# Patient Record
Sex: Male | Born: 1975 | Race: White | Hispanic: No | Marital: Married | Smoking: Never smoker
Health system: Southern US, Community
[De-identification: ages and names within clinical notes are randomized; demographics above are authoritative.]

## PROBLEM LIST (undated history)

## (undated) ENCOUNTER — Ambulatory Visit: Payer: Self-pay

---

## 2013-02-08 ENCOUNTER — Ambulatory Visit: Payer: Self-pay | Admitting: Family Medicine

## 2014-05-27 ENCOUNTER — Ambulatory Visit: Payer: Self-pay | Admitting: Family Medicine

## 2014-06-21 ENCOUNTER — Ambulatory Visit: Payer: Self-pay | Admitting: Emergency Medicine

## 2014-12-14 ENCOUNTER — Emergency Department: Payer: Self-pay | Admitting: Student

## 2014-12-14 LAB — CBC
HCT: 47.8 % (ref 40.0–52.0)
HGB: 16.7 g/dL (ref 13.0–18.0)
MCH: 31.4 pg (ref 26.0–34.0)
MCHC: 35 g/dL (ref 32.0–36.0)
MCV: 90 fL (ref 80–100)
Platelet: 205 10*3/uL (ref 150–440)
RBC: 5.32 10*6/uL (ref 4.40–5.90)
RDW: 12.4 % (ref 11.5–14.5)
WBC: 9 10*3/uL (ref 3.8–10.6)

## 2014-12-14 LAB — BASIC METABOLIC PANEL
Anion Gap: 8 (ref 7–16)
BUN: 14 mg/dL (ref 7–18)
CALCIUM: 9 mg/dL (ref 8.5–10.1)
CO2: 27 mmol/L (ref 21–32)
CREATININE: 1.21 mg/dL (ref 0.60–1.30)
Chloride: 102 mmol/L (ref 98–107)
EGFR (African American): >60
EGFR (Non-African Amer.): >60
Glucose: 95 mg/dL (ref 65–99)
Osmolality: 274 (ref 275–301)
POTASSIUM: 3.6 mmol/L (ref 3.5–5.1)
SODIUM: 137 mmol/L (ref 136–145)

## 2014-12-14 LAB — TROPONIN I: Troponin-I: <0.02 ng/mL

## 2015-03-05 ENCOUNTER — Encounter: Payer: Self-pay | Admitting: Family Medicine

## 2015-03-05 DIAGNOSIS — F1010 Alcohol abuse, uncomplicated: Secondary | ICD-10-CM

## 2015-03-05 DIAGNOSIS — R5381 Other malaise: Secondary | ICD-10-CM | POA: Insufficient documentation

## 2015-03-05 DIAGNOSIS — R5383 Other fatigue: Secondary | ICD-10-CM

## 2015-04-20 IMAGING — CR RIGHT ELBOW - 2 VIEW
2 series · 2 of 2 positions shown · non-contrast
Comparison: None.

CLINICAL DATA: Left elbow pain swelling and redness for 5 days. No
known injury.

EXAM:
RIGHT ELBOW - 2 VIEW

[elbow ap]
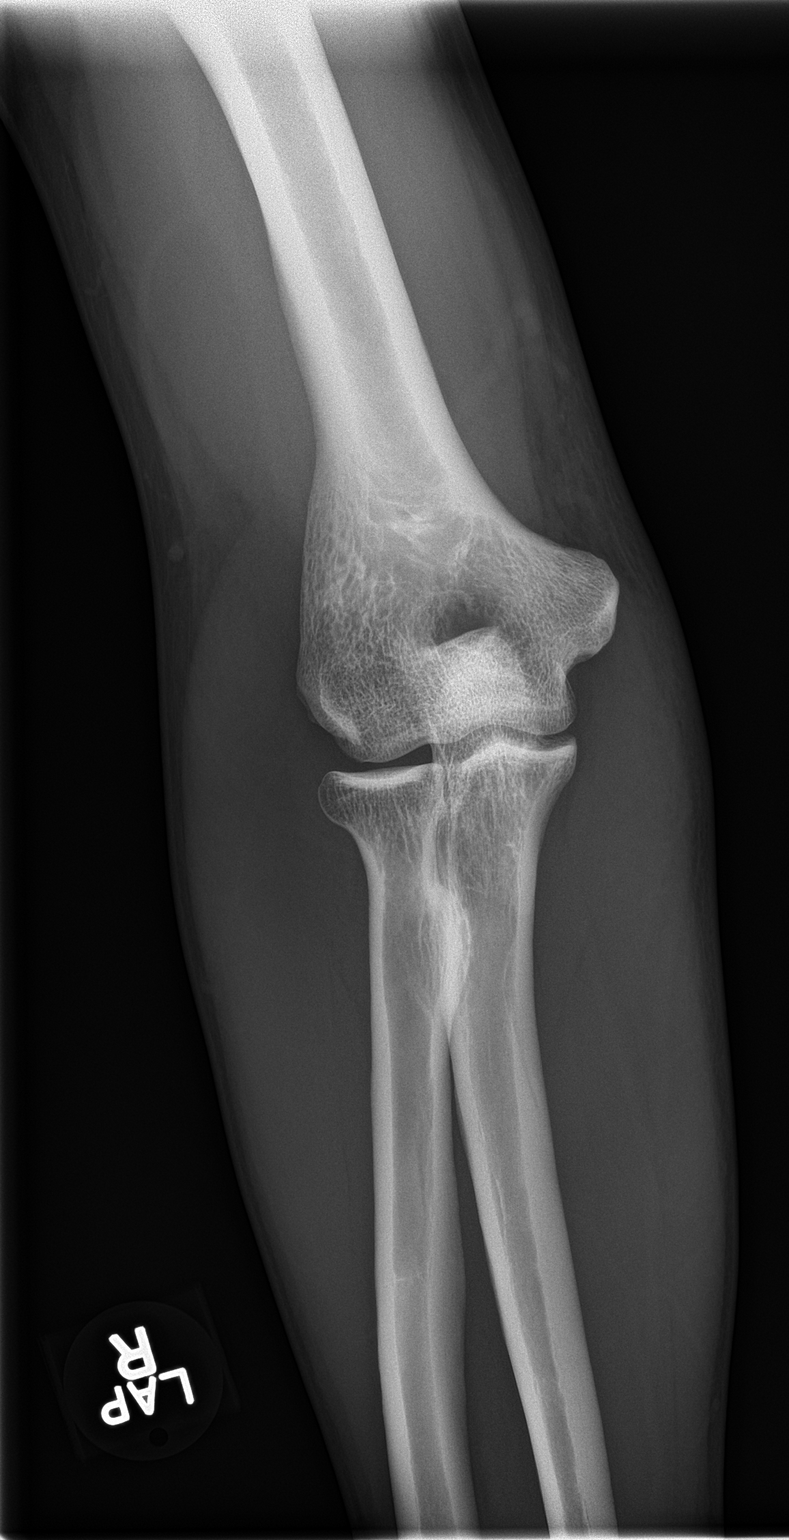

[elbow lat]
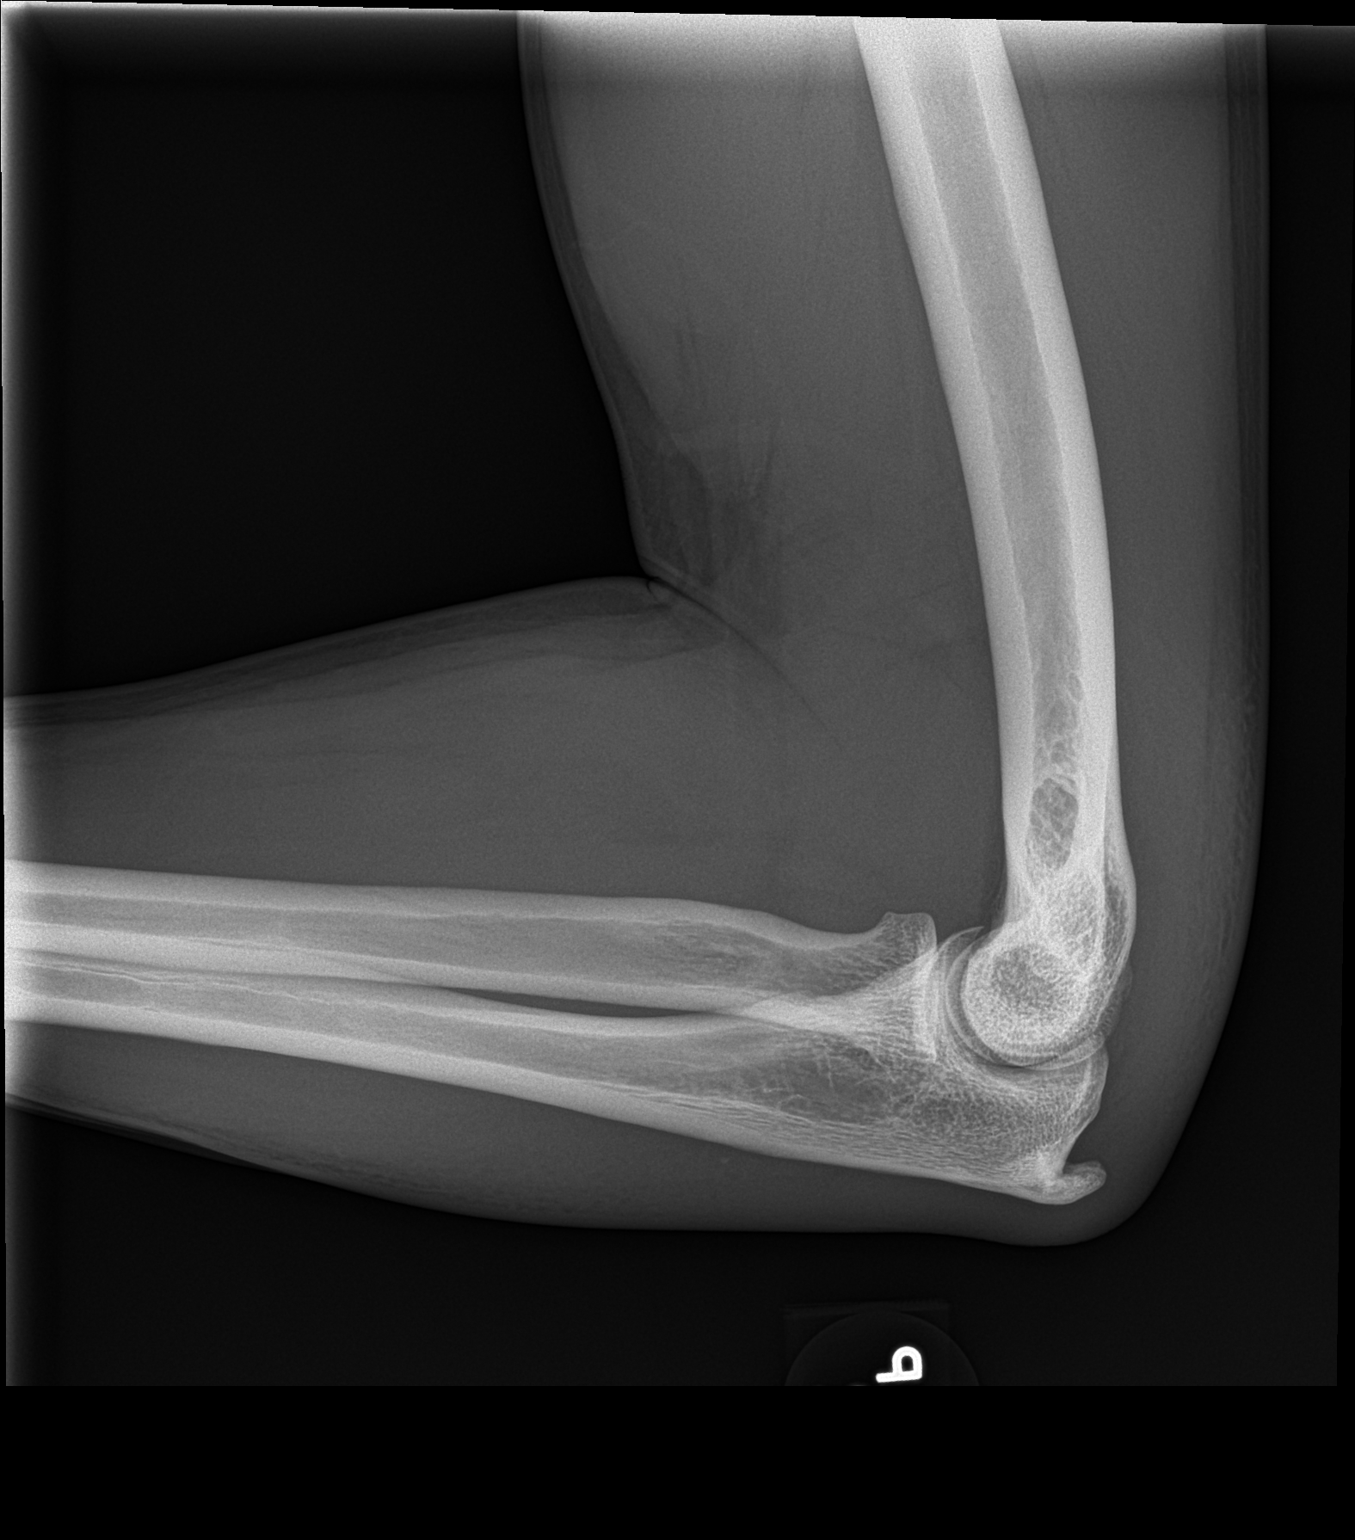

[2 of 2 positions shown; findings below may reference images not displayed]

FINDINGS: There is soft tissue thickening and subcutaneous stranding posterior
and medial to the distal humerus and elbow, suspicious for
cellulitis. Bony mineralization is normal. The elbow joint is
located. Joint spaces are maintained no joint effusion is
appreciated. No acute bony abnormality.
IMPRESSION: Probable cellulitis. No acute bony abnormality or joint effusion
appreciated.

## 2015-07-09 ENCOUNTER — Ambulatory Visit (INDEPENDENT_AMBULATORY_CARE_PROVIDER_SITE_OTHER): Payer: BLUE CROSS/BLUE SHIELD | Admitting: Family Medicine

## 2015-07-09 ENCOUNTER — Encounter: Payer: Self-pay | Admitting: Family Medicine

## 2015-07-09 VITALS — BP 120/80 | HR 64 | Ht 68.0 in | Wt 185.0 lb

## 2015-07-09 DIAGNOSIS — E663 Overweight: Secondary | ICD-10-CM | POA: Insufficient documentation

## 2015-07-09 DIAGNOSIS — F410 Panic disorder [episodic paroxysmal anxiety] without agoraphobia: Secondary | ICD-10-CM | POA: Diagnosis not present

## 2015-07-09 DIAGNOSIS — R011 Cardiac murmur, unspecified: Secondary | ICD-10-CM | POA: Diagnosis not present

## 2015-07-09 DIAGNOSIS — F1010 Alcohol abuse, uncomplicated: Secondary | ICD-10-CM

## 2015-07-09 DIAGNOSIS — R5381 Other malaise: Secondary | ICD-10-CM | POA: Diagnosis not present

## 2015-07-09 DIAGNOSIS — R5383 Other fatigue: Secondary | ICD-10-CM | POA: Diagnosis not present

## 2015-07-09 NOTE — Progress Notes (Signed)
Date:  07/09/2015   Name:  Vincent Alvarado   DOB:  31-Mar-1976   MRN:  [id number]  PCP:  No primary care provider on file.    Chief Complaint: Fatigue   History of Present Illness:  This is a 39 y.o. male last seen 12/25/14 for viral syndrome associated with fatigue and anxiety attack, CMP/CBC/TSH normal at that time. Reports fatigue has persisted since and last week had another episode of sweating, facial numbness, L arm tingling that came on suddenly at rest and resolved after 5-6 minutes. Denies any depressed mood, chronic anxiety, insomnia but does think he is more SOB with exertion and has more problems with ED. Last visit claimed he and wife were planning to stop drinking; today he says he is drinking less than before but still 2-3 beers nightly.  Review of Systems:  Review of Systems  Constitutional: Negative for fever and unexpected weight change.  Cardiovascular: Negative for chest pain, palpitations and leg swelling.  Gastrointestinal: Negative for abdominal pain.  Skin: Negative for rash.  Neurological: Negative for dizziness and syncope.  Hematological: Negative for adenopathy.    Patient Active Problem List   Diagnosis Date Noted  . Overweight (BMI 25.0-29.9) 07/09/2015  . Panic attacks 07/09/2015  . Heart murmur, systolic 07/09/2015  . Malaise and fatigue 03/05/2015  . Alcohol abuse 03/05/2015    Prior to Admission medications   Not on File    No Known Allergies  No past surgical history on file.  Social History  Substance Use Topics  . Smoking status: Never Smoker   . Smokeless tobacco: None  . Alcohol Use: 0.0 oz/week    0 Standard drinks or equivalent per week    No family history on file.  Medication list has been reviewed and updated.  Physical Examination: BP 120/80 mmHg  Pulse 64  Ht  (1.727 m)  Wt 185 lb (83.915 kg)  BMI 28.14 kg/m2  Physical Exam  Constitutional: He is oriented to person, place, and time. He appears  well-developed and well-nourished. No distress.  Cardiovascular: Normal rate and regular rhythm.  Exam reveals no gallop and no friction rub.   2/6 SEM at [facility]  Pulmonary/Chest: Effort normal and breath sounds normal. He has no wheezes. He has no rales.  Abdominal: Soft. He exhibits no distension and no mass. There is no tenderness.  Musculoskeletal: He exhibits no edema.  Neurological: He is alert and oriented to person, place, and time. Coordination normal.  Skin: Skin is warm and dry. No rash noted. He is not diaphoretic.  Psychiatric: He has a normal mood and affect. His behavior is normal.    Assessment and Plan:  1. Malaise and fatigue Unclear etiology, check labs - Monospot - COMPLETE METABOLIC PANEL WITH GFR - CBC - Lyme Ab/Western Blot Reflex - Testosterone - B12  2. Panic attacks Discussed SSRI, pt wants to consider only if sxs become more frequent or severe  3. Heart murmur, systolic Not noted on previous exam, associated with increased DOE - Echocardiogram; Future  4. Overweight (BMI 25.0-29.9)  5. Alcohol abuse Denies current issue but identified as problem last visit  Return in about 2 weeks (around 07/23/2015).  Dionne Ano. Kingsley Spittle MD [facility]  07/09/2015

## 2015-07-10 LAB — CBC
HEMOGLOBIN: 16.7 g/dL (ref 12.6–17.7)
Hematocrit: 48.5 % (ref 37.5–51.0)
MCH: 31.2 pg (ref 26.6–33.0)
MCHC: 34.4 g/dL (ref 31.5–35.7)
MCV: 91 fL (ref 79–97)
PLATELETS: 230 10*3/uL (ref 150–379)
RBC: 5.36 x10E6/uL (ref 4.14–5.80)
RDW: 13.2 % (ref 12.3–15.4)
WBC: 5.4 10*3/uL (ref 3.4–10.8)

## 2015-07-10 LAB — VITAMIN B12: Vitamin B-12: 248 pg/mL (ref 211–946)

## 2015-07-10 LAB — MONONUCLEOSIS SCREEN: MONO SCREEN: NEGATIVE

## 2015-07-10 LAB — TESTOSTERONE: Testosterone: 309 ng/dL — ABNORMAL LOW (ref 348–1197)

## 2015-07-10 LAB — LYME AB/WESTERN BLOT REFLEX: Lyme IgG/IgM Ab: <0.91 {ISR} (ref 0.00–0.90)

## 2015-07-12 ENCOUNTER — Other Ambulatory Visit: Payer: Self-pay | Admitting: Family Medicine

## 2015-07-12 ENCOUNTER — Ambulatory Visit
Admission: RE | Admit: 2015-07-12 | Discharge: 2015-07-12 | Disposition: A | Discharge status: Home / Self Care | Payer: BLUE CROSS/BLUE SHIELD | Source: Ambulatory Visit | Attending: Family Medicine | Admitting: Family Medicine

## 2015-07-12 DIAGNOSIS — I350 Nonrheumatic aortic (valve) stenosis: Secondary | ICD-10-CM | POA: Insufficient documentation

## 2015-07-12 DIAGNOSIS — I351 Nonrheumatic aortic (valve) insufficiency: Secondary | ICD-10-CM | POA: Diagnosis not present

## 2015-07-12 DIAGNOSIS — R011 Cardiac murmur, unspecified: Secondary | ICD-10-CM | POA: Diagnosis present

## 2015-07-12 DIAGNOSIS — I2510 Atherosclerotic heart disease of native coronary artery without angina pectoris: Secondary | ICD-10-CM | POA: Insufficient documentation

## 2015-07-12 LAB — CREATINE: CREATINE, SERUM: 0.4 mg/dL (ref 0.0–0.7)

## 2015-07-12 NOTE — Progress Notes (Signed)
*  PRELIMINARY RESULTS* Echocardiogram 2D Echocardiogram has been performed.  Vincent Alvarado Hege 07/12/2015, 10:50 AM

## 2015-07-13 LAB — COMPREHENSIVE METABOLIC PANEL
A/G RATIO: 2 (ref 1.1–2.5)
ALT: 17 IU/L (ref 0–44)
AST: 19 IU/L (ref 0–40)
Albumin: 4.7 g/dL (ref 3.5–5.5)
Alkaline Phosphatase: 76 IU/L (ref 39–117)
BILIRUBIN TOTAL: 0.3 mg/dL (ref 0.0–1.2)
BUN/Creatinine Ratio: 10 (ref 9–20)
BUN: 11 mg/dL (ref 6–24)
CALCIUM: 9.6 mg/dL (ref 8.7–10.2)
CHLORIDE: 98 mmol/L (ref 97–108)
CO2: 19 mmol/L (ref 18–29)
Creatinine, Ser: 1.13 mg/dL (ref 0.76–1.27)
GFR, EST AFRICAN AMERICAN: 93 mL/min/{1.73_m2} (ref >59)
GFR, EST NON AFRICAN AMERICAN: 81 mL/min/{1.73_m2} (ref >59)
GLOBULIN, TOTAL: 2.3 g/dL (ref 1.5–4.5)
Glucose: 110 mg/dL — ABNORMAL HIGH (ref 65–99)
POTASSIUM: 4.6 mmol/L (ref 3.5–5.2)
SODIUM: 140 mmol/L (ref 134–144)
TOTAL PROTEIN: 7 g/dL (ref 6.0–8.5)

## 2015-07-13 LAB — SPECIMEN STATUS REPORT

## 2015-07-24 ENCOUNTER — Ambulatory Visit: Payer: Self-pay | Admitting: Family Medicine

## 2015-07-25 ENCOUNTER — Encounter: Payer: Self-pay | Admitting: Family Medicine

## 2015-07-25 ENCOUNTER — Ambulatory Visit (INDEPENDENT_AMBULATORY_CARE_PROVIDER_SITE_OTHER): Payer: BLUE CROSS/BLUE SHIELD | Admitting: Family Medicine

## 2015-07-25 VITALS — BP 120/78 | HR 68 | Ht 68.0 in | Wt 186.0 lb

## 2015-07-25 DIAGNOSIS — I350 Nonrheumatic aortic (valve) stenosis: Secondary | ICD-10-CM

## 2015-07-25 DIAGNOSIS — E538 Deficiency of other specified B group vitamins: Secondary | ICD-10-CM | POA: Diagnosis not present

## 2015-07-25 DIAGNOSIS — F410 Panic disorder [episodic paroxysmal anxiety] without agoraphobia: Secondary | ICD-10-CM | POA: Diagnosis not present

## 2015-07-25 DIAGNOSIS — E291 Testicular hypofunction: Secondary | ICD-10-CM

## 2015-07-25 DIAGNOSIS — E663 Overweight: Secondary | ICD-10-CM

## 2015-07-25 DIAGNOSIS — E349 Endocrine disorder, unspecified: Secondary | ICD-10-CM

## 2015-07-25 MED ORDER — VITAMIN B-12 1000 MCG PO TABS: 1000.0000 ug | ORAL_TABLET | Freq: Every day | ORAL | Status: AC

## 2015-07-25 NOTE — Progress Notes (Signed)
Date:  07/25/2015   Name:  Vincent Alvarado   DOB:  [birth year]   MRN:  [id number]  PCP:  PROVIDER NOT IN SYSTEM    Chief Complaint: Follow-up   History of Present Illness:  This is a 39 y.o. male with malaise/fatigue recently dx'd with mod AS, has seen cardiology and had stress test done, plan is to monitor for progression only, has return appt in 3 months. Blood work showed low B12 and testosterone, taking B12 supplement but not helping fatigue. Some decreased libido. No panic attacks since last visit. Declines flu imm.  Review of Systems:  Review of Systems  Respiratory: Negative for shortness of breath.   Cardiovascular: Negative for chest pain and leg swelling.    Patient Active Problem List   Diagnosis Date Noted  . B12 deficiency 07/25/2015  . Testosterone deficiency 07/25/2015  . Aortic stenosis, moderate 07/12/2015  . Moderate aortic regurgitation 07/12/2015  . Overweight (BMI 25.0-29.9) 07/09/2015  . Panic attacks 07/09/2015  . Heart murmur, systolic 07/09/2015  . Malaise and fatigue 03/05/2015  . Alcohol abuse 03/05/2015    Prior to Admission medications   Medication Sig Start Date End Date Taking? Authorizing Provider  vitamin B-12 (CYANOCOBALAMIN) 1000 MCG tablet Take 1 tablet (1,000 mcg total) by mouth daily. 07/25/15   Schuyler Amor, MD    No Known Allergies  History reviewed. No pertinent past surgical history.  Social History  Substance Use Topics  . Smoking status: Never Smoker   . Smokeless tobacco: None  . Alcohol Use: 0.0 oz/week    0 Standard drinks or equivalent per week    History reviewed. No pertinent family history.  Medication list has been reviewed and updated.  Physical Examination: BP 120/78 mmHg  Pulse 68  Ht  (1.727 m)  Wt 186 lb (84.369 kg)  BMI 28.29 kg/m2  Physical Exam  Constitutional: He is oriented to person, place, and time. He appears well-developed and well-nourished.  Cardiovascular: Normal rate and  regular rhythm.   Pulmonary/Chest: Effort normal and breath sounds normal.  Musculoskeletal: He exhibits no edema.  Neurological: He is alert and oriented to person, place, and time.  Skin: Skin is warm and dry.  Psychiatric: His behavior is normal.  Flat affect  Nursing note and vitals reviewed.   Assessment and Plan:  1. B12 deficiency Cont supplementation, recheck level - B12  2. Testosterone deficiency Recheck levels, plan replacement therapy per insurance coverage if still low - Testosterone,Free and Total  3. Aortic stenosis, moderate F/u cardiology  4. Panic attacks Improved, depression may be contributing to sxs, consider SSRI if testosterone replacement ineffective  5. Overweight (BMI 25.0-29.9) Ok to exercise per cardiology   Return in about 4 weeks (around 08/22/2015).  Dionne Ano. Kingsley Spittle MD [facility]  07/25/2015

## 2015-07-26 LAB — VITAMIN B12: VITAMIN B 12: 599 pg/mL (ref 211–946)

## 2015-07-26 LAB — TESTOSTERONE,FREE AND TOTAL: TESTOSTERONE FREE: 7 pg/mL (ref 6.8–21.5)

## 2015-11-07 IMAGING — CR DG CHEST 2V
1 series · 2 of 2 positions shown · non-contrast
Comparison: None.

CLINICAL DATA: Shortness of breath, clammy, feeling shaking

EXAM:
CHEST  2 VIEW

[Series 1: dxr chest pa (or ap) and lateral · 0.14mm/px · 2 of 2 slices shown]
[im 1/2]
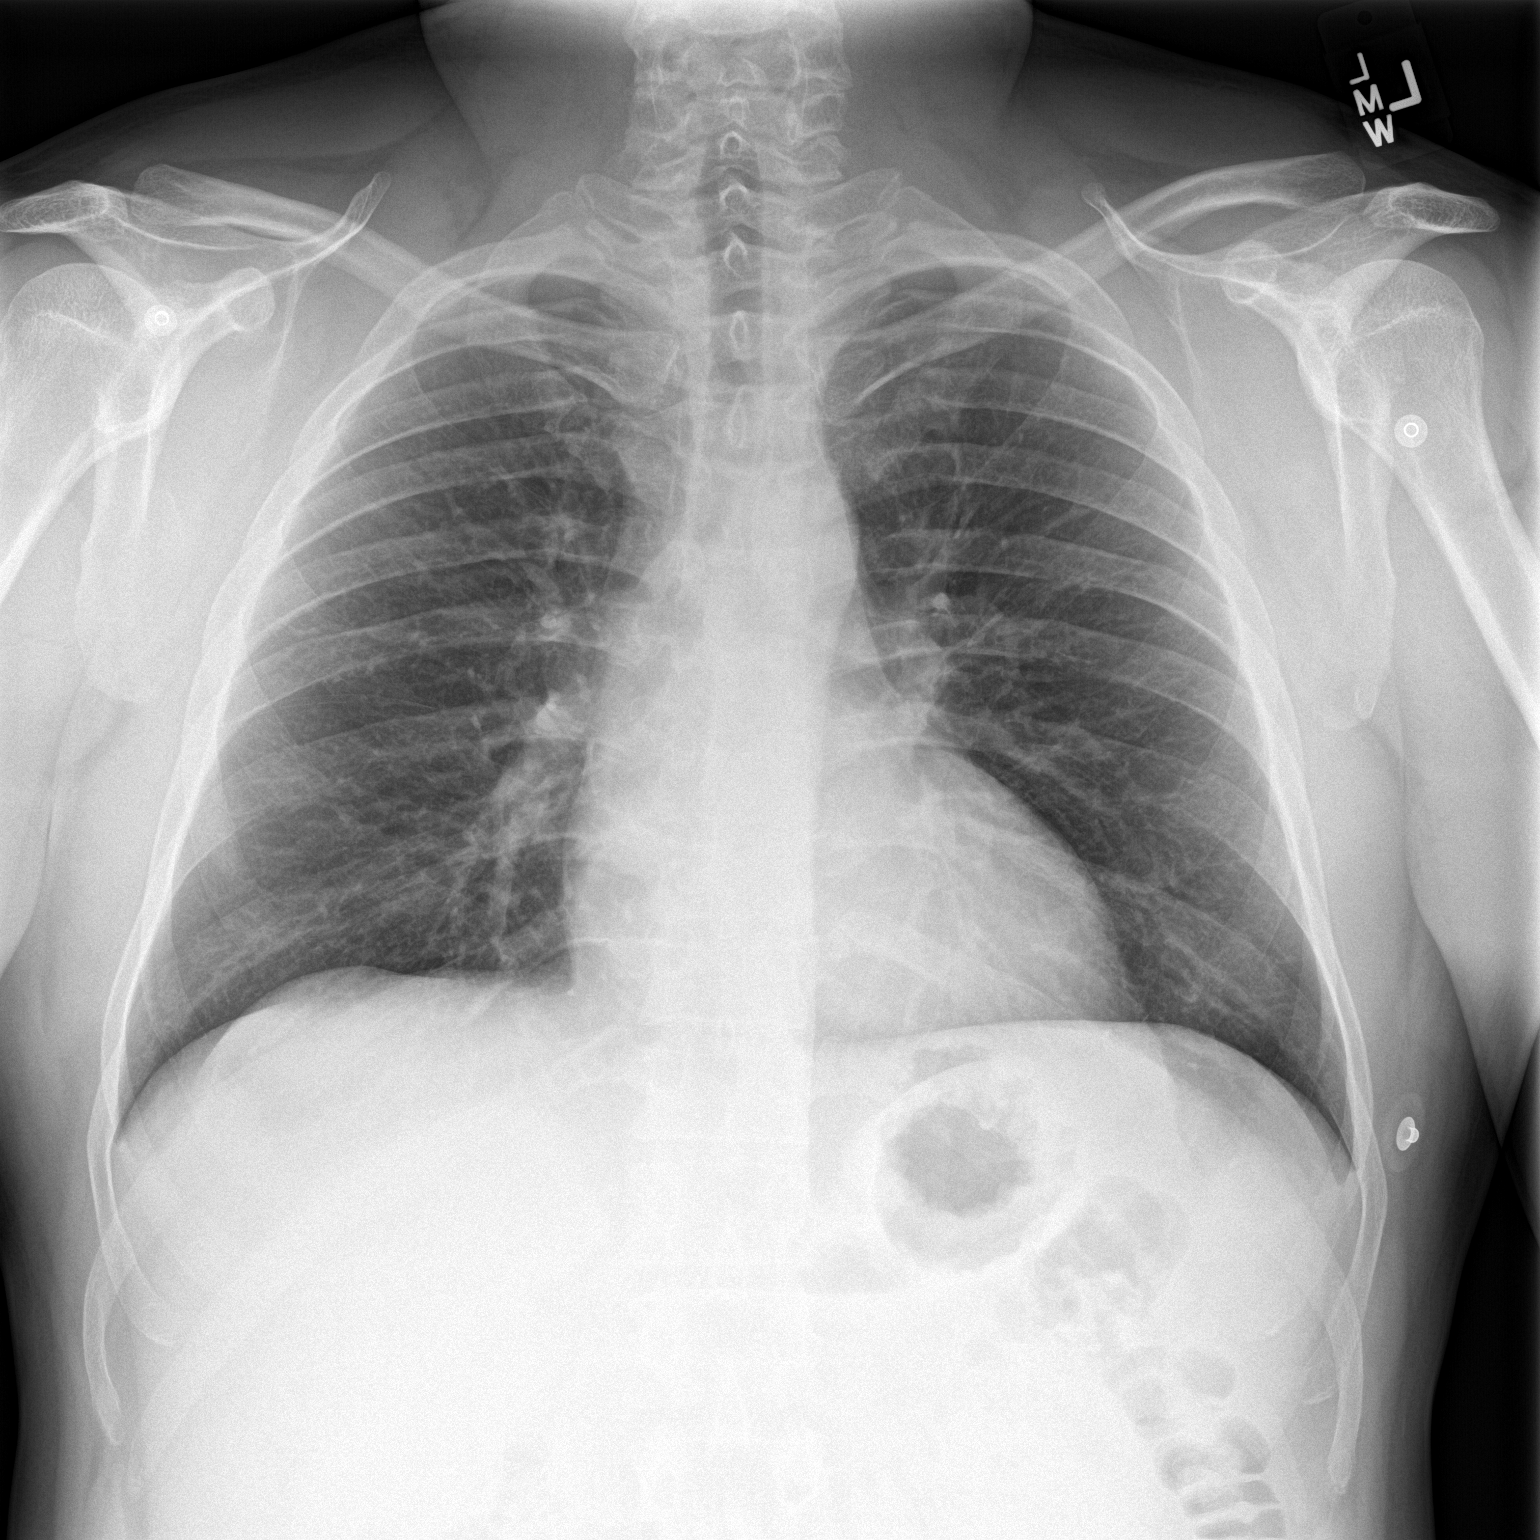
[im 2/2]
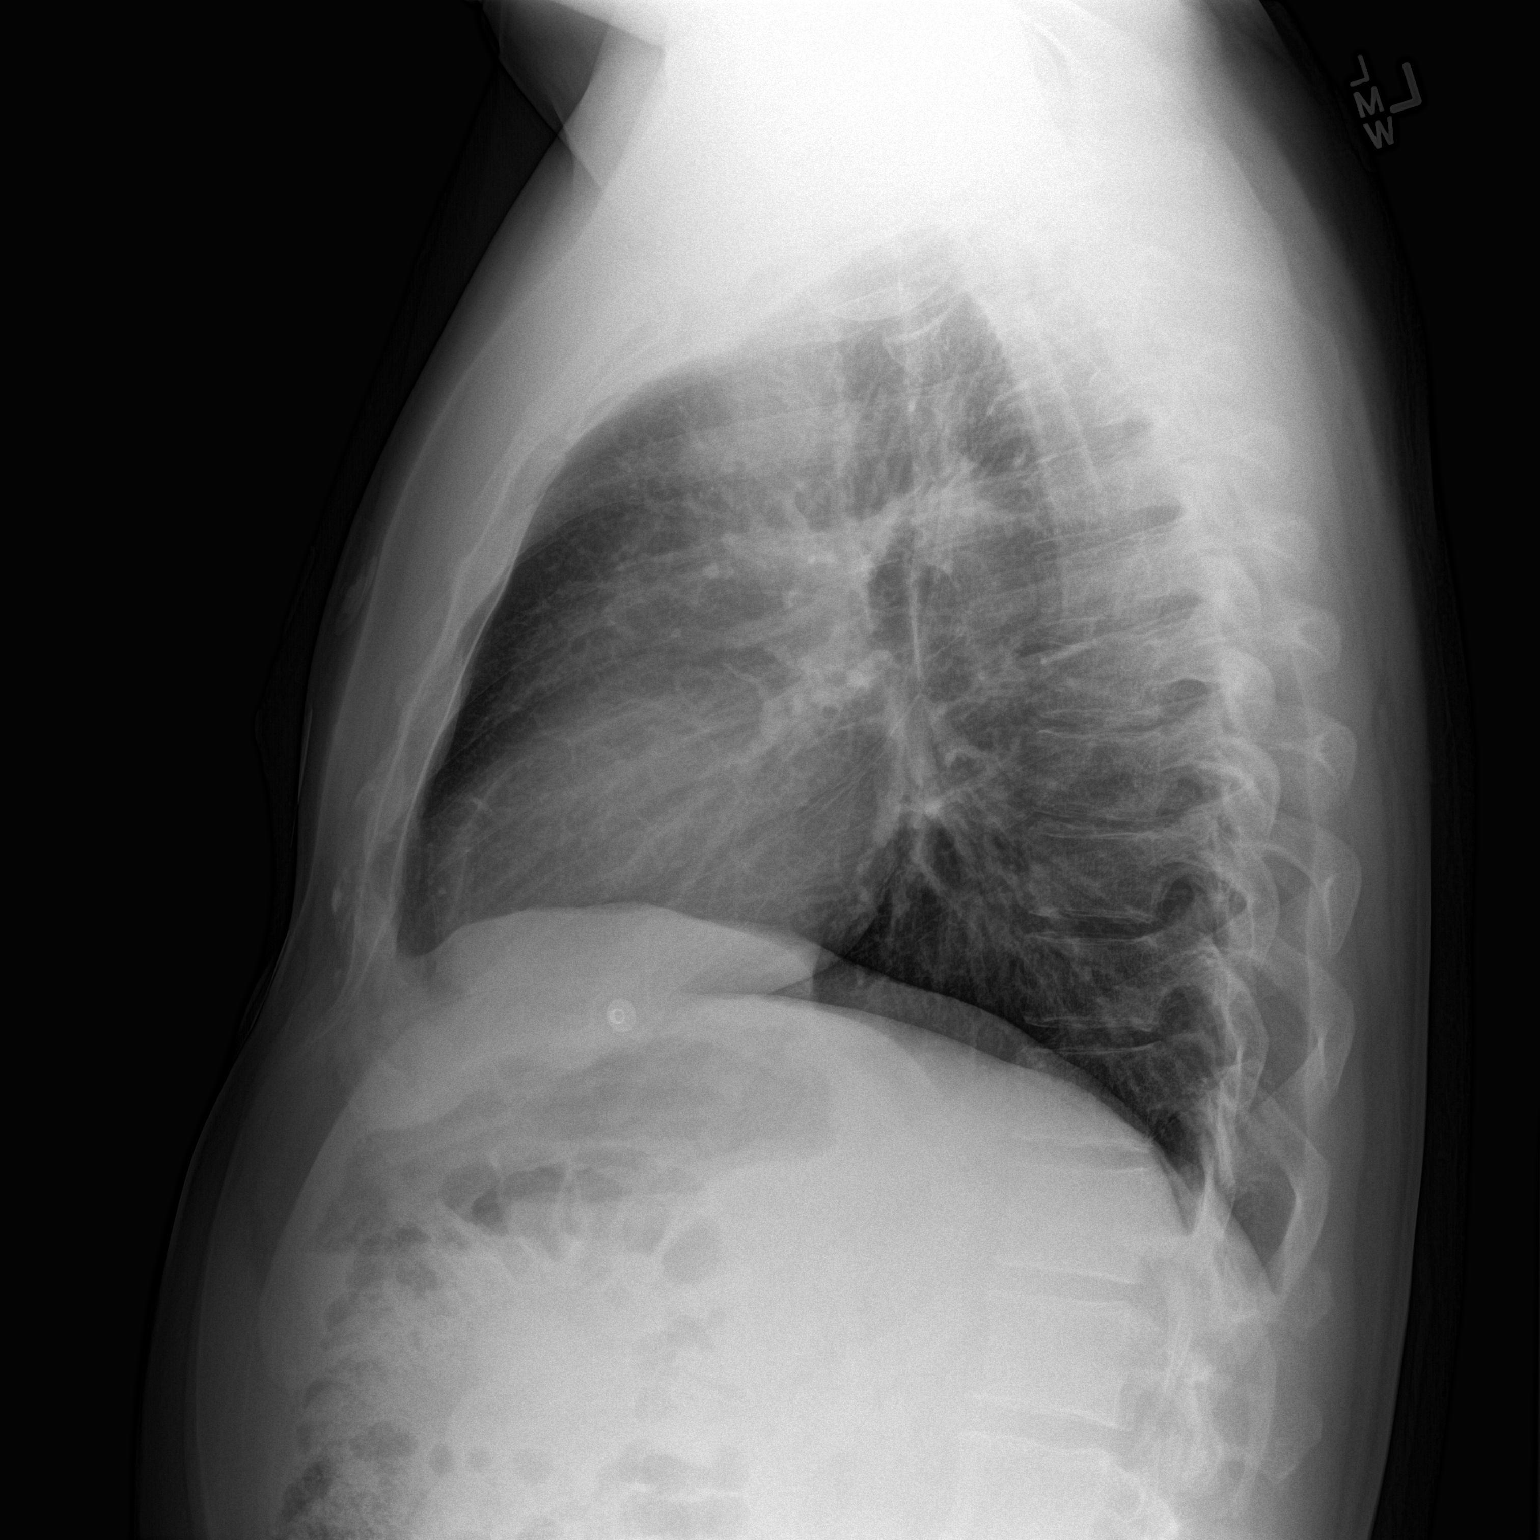

[2 of 2 positions shown; findings below may reference images not displayed]

FINDINGS: The heart size and mediastinal contours are within normal limits.
Both lungs are clear. The visualized skeletal structures are
unremarkable.
IMPRESSION: No active cardiopulmonary disease.

## 2016-05-27 ENCOUNTER — Ambulatory Visit
Admission: EM | Admit: 2016-05-27 | Discharge: 2016-05-27 | Disposition: A | Discharge status: Home / Self Care | Payer: BLUE CROSS/BLUE SHIELD | Attending: Family Medicine | Admitting: Family Medicine

## 2016-05-27 ENCOUNTER — Encounter: Payer: Self-pay | Admitting: *Deleted

## 2016-05-27 DIAGNOSIS — L259 Unspecified contact dermatitis, unspecified cause: Secondary | ICD-10-CM

## 2016-05-27 MED ORDER — PREDNISONE 20 MG PO TABS: ORAL_TABLET | ORAL | Status: AC

## 2016-05-27 MED ORDER — METHYLPREDNISOLONE SODIUM SUCC 125 MG IJ SOLR
125.0000 mg | Freq: Once | INTRAMUSCULAR | Status: AC
  Administered 2016-05-27: 125 mg via INTRAMUSCULAR

## 2016-05-27 NOTE — Discharge Instructions (Signed)
Start oral prednisone tomorrow, take oral antihistamine for the next few days as well. Seek medical attention if symptoms persist or worsen. Contact Dermatitis Dermatitis is redness, soreness, and swelling (inflammation) of the skin. Contact dermatitis is a reaction to certain substances that touch the skin. You either touched something that irritated your skin, or you have allergies to something you touched.  HOME CARE  Skin Care  Moisturize your skin as needed.  Apply cool compresses to the affected areas.   Try taking a bath with:   Epsom salts. Follow the instructions on the package. You can get these at a pharmacy or grocery store.   Baking soda. Pour a small amount into the bath as told by your doctor.   Colloidal oatmeal. Follow the instructions on the package. You can get this at a pharmacy or grocery store.   Try applying baking soda paste to your skin. Stir water into baking soda until it looks like paste.  Do not scratch your skin.   Bathe less often.  Bathe in lukewarm water. Avoid using hot water.  Medicines  Take or apply over-the-counter and prescription medicines only as told by your doctor.   If you were prescribed an antibiotic medicine, take or apply your antibiotic as told by your doctor. Do not stop taking the antibiotic even if your condition starts to get better. General Instructions  Keep all follow-up visits as told by your doctor. This is important.   Avoid the substance that caused your reaction. If you do not know what caused it, keep a journal to try to track what caused it. Write down:   What you eat.   What cosmetic products you use.   What you drink.   What you wear in the affected area. This includes jewelry.   If you were given a bandage (dressing), take care of it as told by your doctor. This includes when to change and remove it.  GET HELP IF:   You do not get better with treatment.   Your condition gets worse.    You have signs of infection such as:  Swelling.  Tenderness.  Redness.  Soreness.  Warmth.   You have a fever.   You have new symptoms.  GET HELP RIGHT AWAY IF:   You have a very bad headache.  You have neck pain.  Your neck is stiff.   You throw up (vomit).   You feel very sleepy.   You see red streaks coming from the affected area.   Your bone or joint underneath the affected area becomes painful after the skin has healed.   The affected area turns darker.   You have trouble breathing.    This information is not intended to replace advice given to you by your health care provider. Make sure you discuss any questions you have with your health care provider.   Document Released: 08/30/2009 Document Revised: 07/24/2015 Document Reviewed: 03/20/2015 Elsevier Interactive Patient Education [employer]2016 [employer]

## 2016-05-27 NOTE — ED Provider Notes (Signed)
CSN: [id number]     Arrival date & time 05/27/16  1630 History   First MD Initiated Contact with Patient 05/27/16 1651     Chief Complaint  Patient presents with  . Rash   (Consider location/radiation/quality/duration/timing/severity/associated sxs/prior Treatment) HPI: Patient presents today with rash on neck area, extremities, face. Patient states that he was trimming some shrubs on Monday and developed a rash on Tuesday. He believes he was around some poison ivy or oak. He denies any draining vesicular lesions. He denies any fever or chills. He denies any recent antibiotics. He denies any chest pain, shortness of breath, swelling of body. He has tried hydrocortisone on the skin exposed areas.  History reviewed. No pertinent past medical history. History reviewed. No pertinent past surgical history. History reviewed. No pertinent family history. Social History  Substance Use Topics  . Smoking status: Never Smoker   . Smokeless tobacco: None  . Alcohol Use: 0.0 oz/week    0 Standard drinks or equivalent per week    Review of Systems: Negative except mentioned above.   Allergies  Review of patient's allergies indicates no known allergies.  Home Medications   Prior to Admission medications   Medication Sig Start Date End Date Taking? Authorizing Provider  vitamin B-12 (CYANOCOBALAMIN) 1000 MCG tablet Take 1 tablet (1,000 mcg total) by mouth daily. 07/25/15   Schuyler AmorWilliam Plonk, MD   Meds Ordered and Administered this Visit  Medications - No data to display  BP 132/86 mmHg  Pulse 59  Temp(Src) 98.4 F (36.9 C) (Oral)  Resp 16  Ht 5\' 8"  (1.727 m)  Wt 180 lb (81.647 kg)  BMI 27.38 kg/m2  SpO2 99% No data found.   Physical Exam:  GENERAL: NAD HEENT: no pharyngeal erythema, no exudate, no erythema of TMs, no cervical LAD RESP: CTA B CARD: RRR SKIN: Erythematous slightly raised rash around the neck area and right antecubital area and along left eyebrow area, there does not  appear to be any secondary infection NEURO: CN II-XII grossly intact   ED Course  Procedures (including critical care time)  Labs Review Labs Reviewed - No data to display  Imaging Review No results found.    MDM  A/P: Skin rash, contact dermatitis - will treat patient with Solumedrol 125 mg IM in the office, encouraged oral antihistamine for the next few days, will start oral prednisone tomorrow for a few days, keep area clean and dry, seek medical attention if symptoms persist or worsen as discussed.     Jolene ProvostKirtida Fate Caster, MD 05/27/16 [phone]

## 2016-05-27 NOTE — ED Notes (Signed)
Pt trimming shrubs Monday and cut poison ivy. Has rash to chest, neck, arms, and face.

## 2022-06-10 DIAGNOSIS — L03113 Cellulitis of right upper limb: Secondary | ICD-10-CM | POA: Diagnosis not present
